# Patient Record
Sex: Male | Born: 1990 | Race: White | Hispanic: No | Marital: Single | Smoking: Former smoker
Health system: Southern US, Community
[De-identification: ages and names within clinical notes are randomized; demographics above are authoritative.]

## PROBLEM LIST (undated history)

## (undated) DIAGNOSIS — N289 Disorder of kidney and ureter, unspecified: Secondary | ICD-10-CM

## (undated) DIAGNOSIS — G43909 Migraine, unspecified, not intractable, without status migrainosus: Secondary | ICD-10-CM

## (undated) DIAGNOSIS — I10 Essential (primary) hypertension: Secondary | ICD-10-CM

---

## 2017-08-06 ENCOUNTER — Encounter: Payer: Self-pay | Admitting: Emergency Medicine

## 2017-08-06 ENCOUNTER — Emergency Department
Admission: EM | Admit: 2017-08-06 | Discharge: 2017-08-06 | Disposition: A | Discharge status: Home / Self Care | Payer: Commercial Managed Care - PPO | Attending: Emergency Medicine | Admitting: Emergency Medicine

## 2017-08-06 ENCOUNTER — Other Ambulatory Visit: Payer: Self-pay

## 2017-08-06 DIAGNOSIS — I10 Essential (primary) hypertension: Secondary | ICD-10-CM | POA: Diagnosis not present

## 2017-08-06 DIAGNOSIS — R51 Headache: Secondary | ICD-10-CM | POA: Diagnosis present

## 2017-08-06 DIAGNOSIS — G43809 Other migraine, not intractable, without status migrainosus: Secondary | ICD-10-CM

## 2017-08-06 HISTORY — DX: Migraine, unspecified, not intractable, without status migrainosus: G43.909

## 2017-08-06 HISTORY — DX: Essential (primary) hypertension: I10

## 2017-08-06 HISTORY — DX: Disorder of kidney and ureter, unspecified: N28.9

## 2017-08-06 MED ORDER — KETOROLAC TROMETHAMINE 30 MG/ML IJ SOLN
30.0000 mg | Freq: Once | INTRAMUSCULAR | Status: AC
  Administered 2017-08-06: 30 mg via INTRAVENOUS
  Filled 2017-08-06: qty 1

## 2017-08-06 MED ORDER — MAGNESIUM SULFATE IN D5W 1-5 GM/100ML-% IV SOLN
1.0000 g | Freq: Once | INTRAVENOUS | Status: AC
  Administered 2017-08-06: 1 g via INTRAVENOUS
  Filled 2017-08-06: qty 100

## 2017-08-06 MED ORDER — KETOROLAC TROMETHAMINE 10 MG PO TABS: 10.0000 mg | ORAL_TABLET | Freq: Three times a day (TID) | ORAL | 0 refills | Status: AC | PRN

## 2017-08-06 MED ORDER — PROCHLORPERAZINE EDISYLATE 5 MG/ML IJ SOLN
10.0000 mg | Freq: Once | INTRAMUSCULAR | Status: AC
  Administered 2017-08-06: 10 mg via INTRAVENOUS
  Filled 2017-08-06: qty 2

## 2017-08-06 MED ORDER — SODIUM CHLORIDE 0.9 % IV BOLUS (SEPSIS)
1000.0000 mL | Freq: Once | INTRAVENOUS | Status: AC
  Administered 2017-08-06: 1000 mL via INTRAVENOUS

## 2017-08-06 MED ORDER — VALSARTAN-HYDROCHLOROTHIAZIDE 320-25 MG PO TABS: 1.0000 | ORAL_TABLET | Freq: Every day | ORAL | 0 refills | Status: AC

## 2017-08-06 NOTE — Discharge Instructions (Signed)
Please seek medical attention for any high fevers, chest pain, shortness of breath, change in behavior, persistent vomiting, bloody stool or any other new or concerning symptoms.  

## 2017-08-06 NOTE — ED Notes (Signed)
MD at bedside to update pt

## 2017-08-06 NOTE — ED Notes (Signed)
Pt talking very quietly but answering all questions correctly. PT reports he has 2 medications for BP and has not been taking either one for a month. PT reports he could not afford the medication and now has insurance but no prescription for medication.

## 2017-08-06 NOTE — ED Provider Notes (Signed)
[facility] Emergency Department Provider Note   ____________________________________________   I have reviewed the triage vital signs and the nursing notes.   HISTORY  Chief Complaint Headache and Hypertension   History limited by: Not Limited   HPI Crissie SicklesLucas Basnett is a 26 y.o. male who presents to the emergency department today because of headache.   LOCATION:behind right eye DURATION:6 hours TIMING: started gradually and has worsened SEVERITY: severe CONTEXT: patient states he has a history of migraines. Today's headache feels like his normal migraine headache. He does not have any dedicated migraine medication but states he does have blood pressure medication - however he ran out roughly 1 month ago. MODIFYING FACTORS: lights make it worse ASSOCIATED SYMPTOMS: has had some nausea.  Per medical record review patient has a history of migraines, HTN.  Past Medical History:  Diagnosis Date  . Hypertension   . Migraines   . Renal disorder     There are no active problems to display for this patient.     Prior to Admission medications   Not on File    Allergies Patient has no known allergies.  No family history on file.  Social History Social History   Tobacco Use  . Smoking status: Never Smoker  . Smokeless tobacco: Never Used  Substance Use Topics  . Alcohol use: No    Frequency: Never  . Drug use: No    Review of Systems Constitutional: No fever/chills Eyes: Positive for photophobia.  ENT: No sore throat. Cardiovascular: Denies chest pain. Respiratory: Denies shortness of breath. Gastrointestinal: No abdominal pain.  No nausea, no vomiting.  No diarrhea.   Genitourinary: Negative for dysuria. Musculoskeletal: Negative for back pain. Skin: Negative for rash. Neurological: Positive for headache.  ____________________________________________   PHYSICAL EXAM:  VITAL SIGNS: ED Triage Vitals  Enc Vitals Group     BP  08/06/17 1646 (!) 173/121     Pulse Rate 08/06/17 1646 91     Resp 08/06/17 1646 16     Temp 08/06/17 1646 97.9 F (36.6 C)     Temp Source 08/06/17 1646 Oral     SpO2 08/06/17 1646 97 %     Weight 08/06/17 1646 246 lb (111.6 kg)     Height 08/06/17 1646 6' (1.829 m)     Head Circumference --      Peak Flow --      Pain Score 08/06/17 1645 8    Constitutional: Alert and oriented. Appears uncomfortable.  Eyes: Conjunctivae are normal.  ENT   Head: Normocephalic and atraumatic.   Nose: No congestion/rhinnorhea.   Mouth/Throat: Mucous membranes are moist.   Neck: No stridor. Hematological/Lymphatic/Immunilogical: No cervical lymphadenopathy. Cardiovascular: Normal rate, regular rhythm.  No murmurs, rubs, or gallops.  Respiratory: Normal respiratory effort without tachypnea nor retractions. Breath sounds are clear and equal bilaterally. No wheezes/rales/rhonchi. Gastrointestinal: Soft and non tender. No rebound. No guarding.  Genitourinary: Deferred Musculoskeletal: Normal range of motion in all extremities. No lower extremity edema. Neurologic:  Normal speech and language. No gross focal neurologic deficits are appreciated.  Skin:  Skin is warm, dry and intact. No rash noted. Psychiatric: Mood and affect are normal. Speech and behavior are normal. Patient exhibits appropriate insight and judgment.  ____________________________________________    LABS (pertinent positives/negatives)  None  ____________________________________________   EKG  None  ____________________________________________    RADIOLOGY  None   ____________________________________________   PROCEDURES  Procedures  ____________________________________________   INITIAL IMPRESSION / ASSESSMENT AND PLAN /  ED COURSE  Pertinent labs & imaging results that were available during my care of the patient were reviewed by me and considered in my medical decision making (see chart for  details).  Patient presented to the emergency department today because of concerns for severe migraine and high blood pressure.  Patient has history of migraines.  States this feels similar to these migraines in the past.  Did not think any emergent neuro imaging is required.  Patient did feel better after IV fluids and medication.  Will give patient prescription for home hypertensive medication.  Confirmed dose with patient.  Patient states he will try to follow up with a primary care doctor in roughly 1 week.  ____________________________________________   FINAL CLINICAL IMPRESSION(S) / ED DIAGNOSES  Final diagnoses:  Other migraine without status migrainosus, not intractable  Hypertension, unspecified type     Note: This dictation was prepared with Dragon dictation. Any transcriptional errors that result from this process are unintentional     Phineas SemenGoodman, Francisco Ostrovsky, MD 08/06/17 [phone]

## 2017-08-06 NOTE — ED Triage Notes (Signed)
Pt to ED via POV c/o migraine and high blood pressure. Pt states that he has hx/o HTN but he has not taken this blood pressure medication for "a while". Pt states that he has not had insurance to get his medication.   Pt states that today around 1130 his head started hurting, pt is having N/V, photosensitivity. Pt appears uncomfortable.

## 2018-10-17 ENCOUNTER — Other Ambulatory Visit: Payer: Self-pay

## 2018-10-17 ENCOUNTER — Emergency Department
Admission: EM | Admit: 2018-10-17 | Discharge: 2018-10-17 | Disposition: A | Discharge status: Home / Self Care | Payer: Commercial Managed Care - PPO | Attending: Emergency Medicine | Admitting: Emergency Medicine

## 2018-10-17 ENCOUNTER — Emergency Department: Payer: Commercial Managed Care - PPO

## 2018-10-17 DIAGNOSIS — R519 Headache, unspecified: Secondary | ICD-10-CM

## 2018-10-17 DIAGNOSIS — I10 Essential (primary) hypertension: Secondary | ICD-10-CM | POA: Insufficient documentation

## 2018-10-17 DIAGNOSIS — Z79899 Other long term (current) drug therapy: Secondary | ICD-10-CM | POA: Insufficient documentation

## 2018-10-17 DIAGNOSIS — R51 Headache: Secondary | ICD-10-CM | POA: Diagnosis not present

## 2018-10-17 DIAGNOSIS — Z87891 Personal history of nicotine dependence: Secondary | ICD-10-CM | POA: Diagnosis not present

## 2018-10-17 MED ORDER — PROCHLORPERAZINE EDISYLATE 10 MG/2ML IJ SOLN
10.0000 mg | Freq: Once | INTRAMUSCULAR | Status: AC
  Administered 2018-10-17: 10 mg via INTRAVENOUS
  Filled 2018-10-17: qty 2

## 2018-10-17 MED ORDER — KETOROLAC TROMETHAMINE 30 MG/ML IJ SOLN
15.0000 mg | Freq: Once | INTRAMUSCULAR | Status: AC
  Administered 2018-10-17: 15 mg via INTRAVENOUS
  Filled 2018-10-17: qty 1

## 2018-10-17 MED ORDER — SODIUM CHLORIDE 0.9 % IV BOLUS
1000.0000 mL | Freq: Once | INTRAVENOUS | Status: AC
  Administered 2018-10-17: 1000 mL via INTRAVENOUS

## 2018-10-17 MED ORDER — DIPHENHYDRAMINE HCL 50 MG/ML IJ SOLN
12.5000 mg | Freq: Once | INTRAMUSCULAR | Status: AC
  Administered 2018-10-17: 12.5 mg via INTRAVENOUS
  Filled 2018-10-17: qty 1

## 2018-10-17 NOTE — ED Triage Notes (Signed)
Pt states HA since 4pm yesterday. Hx migraines. A&O, ambulatory. Points to top of head. No distress noted. States movement makes it worse.

## 2018-10-17 NOTE — ED Provider Notes (Addendum)
[facility] Emergency Department Provider Note  ____________________________________________   I have reviewed the triage vital signs and the nursing notes. Where available I have reviewed prior notes and, if possible and indicated, outside hospital notes.    HISTORY  Chief Complaint Headache    HPI Clarence Hardy is a 28 y.o. male   Who presents today complaining of a headache.  Patient has a long history of headaches going back several years.  He states that he has headaches for a month and sometimes every few weeks he has never seen a neurologist he has been to the emergency room here in [place] for this several times.  He states he is never had any imaging.  He is having his usual headache.  No stiff neck no fever, no cough no diarrhea.  Nodule onset today, nothing atypical about this for him.  No focal neurologic findings.  In between headaches has been and continues to be at his baseline with no neurologic symptoms.  He has never had any significant head injury that he can think of.  He has no focal symptoms.  He says sometimes these are triggered by URIs, sometimes they are triggered by stress sometimes they just come on their own.    Past Medical History:  Diagnosis Date  . Hypertension   . Migraines   . Renal disorder     There are no active problems to display for this patient.   History reviewed. No pertinent surgical history.  Prior to Admission medications   Medication Sig Start Date End Date Taking? Authorizing Provider  ketorolac (TORADOL) 10 MG tablet Take 1 tablet (10 mg total) by mouth every 8 (eight) hours as needed for severe pain. 08/06/17   Phineas Semen, MD  valsartan-hydrochlorothiazide (DIOVAN-HCT) 320-25 MG tablet Take 1 tablet by mouth daily. 08/06/17   Phineas Semen, MD    Allergies Patient has no known allergies.  History reviewed. No pertinent family history.  Social History Social History   Tobacco Use  .  Smoking status: Former Games developer  . Smokeless tobacco: Never Used  Substance Use Topics  . Alcohol use: Yes    Frequency: Never  . Drug use: No    Review of Systems Constitutional: No fever/chills Eyes: No visual changes. ENT: No sore throat. No stiff neck no neck pain Cardiovascular: Denies chest pain. Respiratory: Denies shortness of breath. Gastrointestinal:   no vomiting.  No diarrhea.  No constipation. Genitourinary: Negative for dysuria. Musculoskeletal: Negative lower extremity swelling Skin: Negative for rash. Neurological: Negative for severe atypical headaches, focal weakness or numbness.   ____________________________________________   PHYSICAL EXAM:  VITAL SIGNS: ED Triage Vitals  Enc Vitals Group     BP 10/17/18 1714 121/82     Pulse Rate 10/17/18 1714 (!) 108     Resp 10/17/18 1714 16     Temp 10/17/18 1714 100.1 F (37.8 C)     Temp Source 10/17/18 1714 Oral     SpO2 10/17/18 1714 97 %     Weight 10/17/18 1715 250 lb (113.4 kg)     Height 10/17/18 1715 6' (1.829 m)     Head Circumference --      Peak Flow --      Pain Score 10/17/18 1718 4     Pain Loc --      Pain Edu? --      Excl. in GC? --     Constitutional: Alert and oriented. Well appearing and in no acute  distress.  Keeps his eyes closed but can open them with no difficulty he is awake and alert and looks quite well except for he appears to be mildly uncomfortable from a headache Eyes: Conjunctivae are normal Head: Atraumatic HEENT: No congestion/rhinnorhea. Mucous membranes are moist.  Oropharynx non-erythematous Neck:   Nontender with no meningismus, no masses, no stridor Cardiovascular: Normal rate, regular rhythm. Grossly normal heart sounds.  Good peripheral circulation. Respiratory: Normal respiratory effort.  No retractions. Lungs CTAB. Abdominal: Soft and nontender. No distention. No guarding no rebound Back:  There is no focal tenderness or step off.  there is no midline tenderness  there are no lesions noted. there is no CVA tenderness Musculoskeletal: No lower extremity tenderness, no upper extremity tenderness. No joint effusions, no DVT signs strong distal pulses no edema Neurologic: Cranial nerves II through XII are grossly intact 5 out of 5 strength bilateral upper and lower extremity. Finger to nose within normal limits heel to shin within normal limits, speech is normal with no word finding difficulty or dysarthria, reflexes symmetric, pupils are equally round and reactive to light, there is no pronator drift, sensation is normal, vision is intact to confrontation, gait is deferred, there is no nystagmus, normal neurologic exam Skin:  Skin is warm, dry and intact. No rash noted. Psychiatric: Mood and affect are normal. Speech and behavior are normal.  ____________________________________________   LABS (all labs ordered are listed, but only abnormal results are displayed)  Labs Reviewed - No data to display  Pertinent labs  results that were available during my care of the patient were reviewed by me and considered in my medical decision making (see chart for details). ____________________________________________  EKG  I personally interpreted any EKGs ordered by me or triage  ____________________________________________  RADIOLOGY  Pertinent labs & imaging results that were available during my care of the patient were reviewed by me and considered in my medical decision making (see chart for details). If possible, patient and/or family made aware of any abnormal findings.  No results found. ____________________________________________    PROCEDURES  Procedure(s) performed: None  Procedures  Critical Care performed: None  ____________________________________________   INITIAL IMPRESSION / ASSESSMENT AND PLAN / ED COURSE  Pertinent labs & imaging results that were available during my care of the patient were reviewed by me and considered in my  medical decision making (see chart for details).  Here with a headache which she describes as his usual migraine which is chronic and recurrent.  No evidence of meningitis nothing to suggest head bleed however patient has had migraines that are debilitating and very frequent for years and never had any imaging.  I will obtain a CT scan as a screening exam, he likely will also need an outpatient MRI, we will give him medication to see if we can get his headache better, he is nontoxic in appearance I have low suspicion for other acute intracranial pathology is my hope that the migraine cocktail and or other interventions will make him feel better and that the imaging will at least give [place] some idea as a get an basic screening test, to make sure that there is no other large abnormality noted which would I hope to get him time to see neurology for his chronic headaches.  ----------------------------------------- 8:19 PM on 10/17/2018 -----------------------------------------  Patient feeling somewhat better CT negative, neurologically intact signed out the end of my shift document    ____________________________________________   FINAL CLINICAL IMPRESSION(S) / ED DIAGNOSES  Final diagnoses:  None      This chart was dictated using voice recognition software.  Despite best efforts to proofread,  errors can occur which can change meaning.      Jeanmarie PlantMcShane,  A, MD 10/17/18 Wynetta [email or web address]    Jeanmarie PlantMcShane,  A, MD 10/17/18 2019

## 2018-10-17 NOTE — Discharge Instructions (Addendum)
Please seek medical attention for any high fevers, chest pain, shortness of breath, change in behavior, persistent vomiting, bloody stool or any other new or concerning symptoms.  

## 2018-10-17 NOTE — ED Notes (Signed)
Pt arrives with c/o migraine headache - he has hx of same - report nausea and photophobia - headache usually resolves with excedrin migraine but this time has continued

## 2018-10-18 DIAGNOSIS — R51 Headache: Secondary | ICD-10-CM | POA: Diagnosis not present

## 2018-10-25 DIAGNOSIS — G43019 Migraine without aura, intractable, without status migrainosus: Secondary | ICD-10-CM | POA: Diagnosis not present

## 2018-10-25 DIAGNOSIS — R0689 Other abnormalities of breathing: Secondary | ICD-10-CM | POA: Diagnosis not present

## 2018-10-25 DIAGNOSIS — E538 Deficiency of other specified B group vitamins: Secondary | ICD-10-CM | POA: Diagnosis not present

## 2018-10-25 DIAGNOSIS — E559 Vitamin D deficiency, unspecified: Secondary | ICD-10-CM | POA: Diagnosis not present

## 2018-10-25 DIAGNOSIS — R0683 Snoring: Secondary | ICD-10-CM | POA: Diagnosis not present

## 2018-10-31 ENCOUNTER — Other Ambulatory Visit: Payer: Self-pay | Admitting: Neurology

## 2018-10-31 DIAGNOSIS — G43019 Migraine without aura, intractable, without status migrainosus: Secondary | ICD-10-CM

## 2018-11-05 ENCOUNTER — Other Ambulatory Visit: Payer: Self-pay

## 2018-11-05 ENCOUNTER — Ambulatory Visit
Admission: RE | Admit: 2018-11-05 | Discharge: 2018-11-05 | Disposition: A | Discharge status: Home / Self Care | Payer: Commercial Managed Care - PPO | Source: Ambulatory Visit | Attending: Neurology | Admitting: Neurology

## 2018-11-05 ENCOUNTER — Ambulatory Visit: Payer: Commercial Managed Care - PPO

## 2018-11-05 DIAGNOSIS — G43019 Migraine without aura, intractable, without status migrainosus: Secondary | ICD-10-CM | POA: Diagnosis not present

## 2018-11-05 DIAGNOSIS — R42 Dizziness and giddiness: Secondary | ICD-10-CM | POA: Diagnosis not present

## 2018-11-05 MED ORDER — GADOBUTROL 1 MMOL/ML IV SOLN
10.0000 mL | Freq: Once | INTRAVENOUS | Status: AC | PRN
  Administered 2018-11-05: 10 mL via INTRAVENOUS

## 2018-11-26 DIAGNOSIS — G43019 Migraine without aura, intractable, without status migrainosus: Secondary | ICD-10-CM | POA: Diagnosis not present

## 2020-05-20 IMAGING — CT CT HEAD W/O CM
3 series · 15 of 47 positions shown, 18 images · non-contrast
Comparison: None.

CLINICAL DATA: Recurrent chronic headaches.

EXAM:
CT HEAD WITHOUT CONTRAST
TECHNIQUE: Contiguous axial images were obtained from the base of the skull
through the vertex without intravenous contrast.

[Series 2: head wo · axial · 0.47mm/px · z∈[-147,-12]mm · 9 of 33 slices shown, 12 images]
[im 3/33  brain]
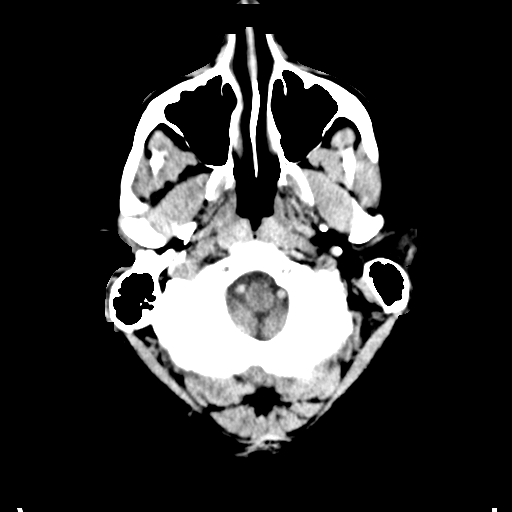
[im 3/33  bone]
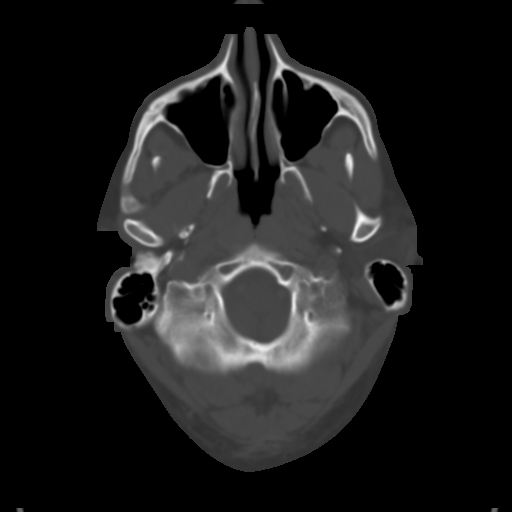
[im 6/33  brain]
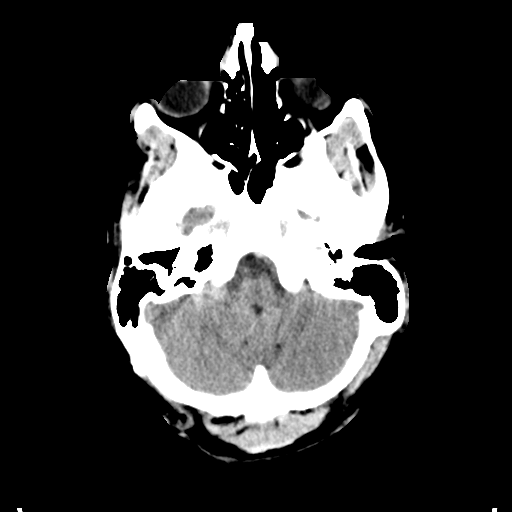
[im 9/33  brain]
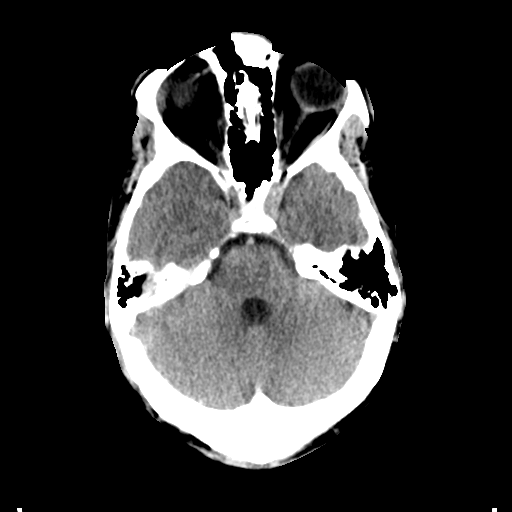
[im 13/33  brain]
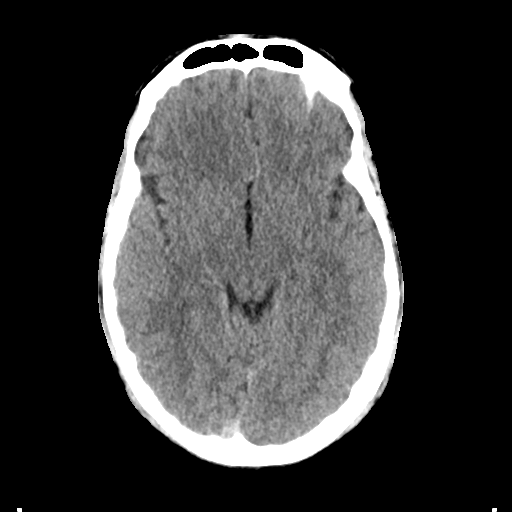
[im 17/33  brain]
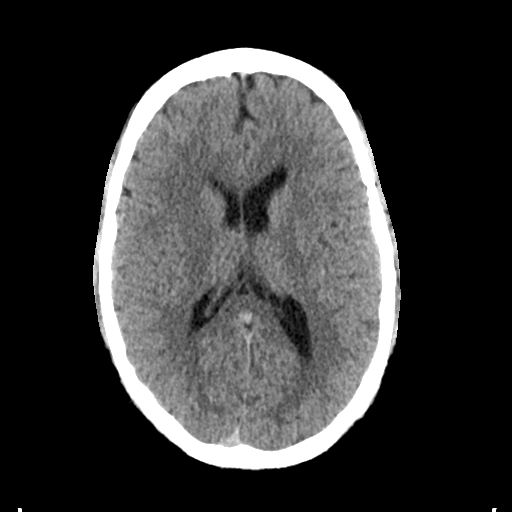
[im 17/33  bone]
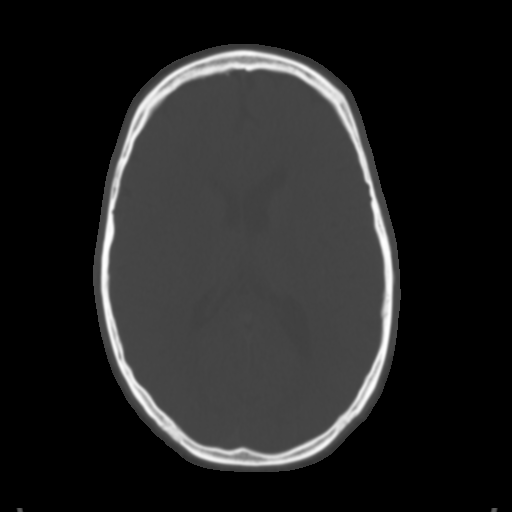
[im 20/33  brain]
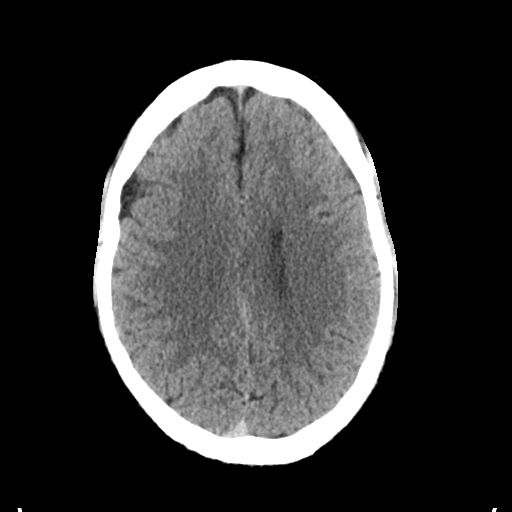
[im 24/33  brain]
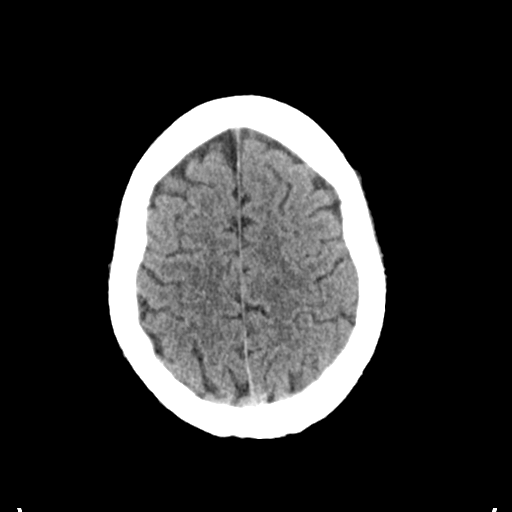
[im 27/33  brain]
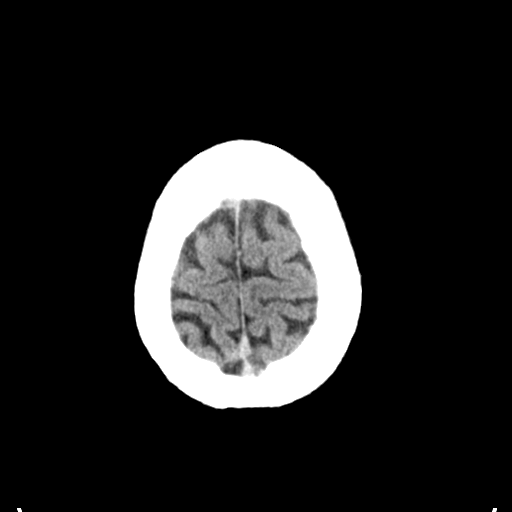
[im 30/33  brain]
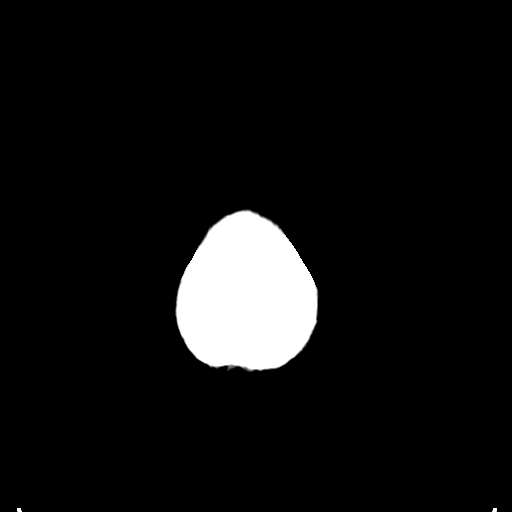
[im 30/33  bone]
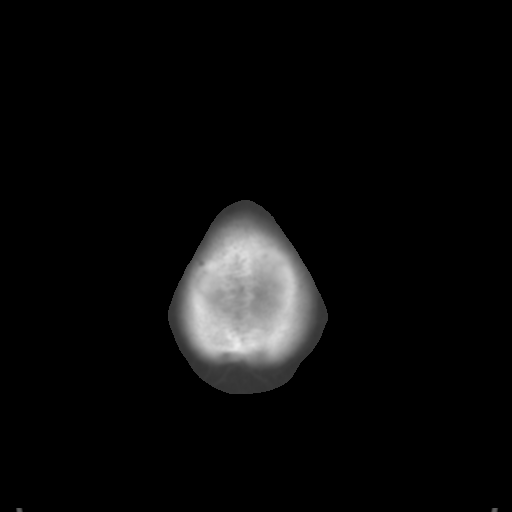

[Series 4: coronal soft tissue · coronal · 0.32mm/px · 3 of 69 slices shown]
[im 23/69  brain]
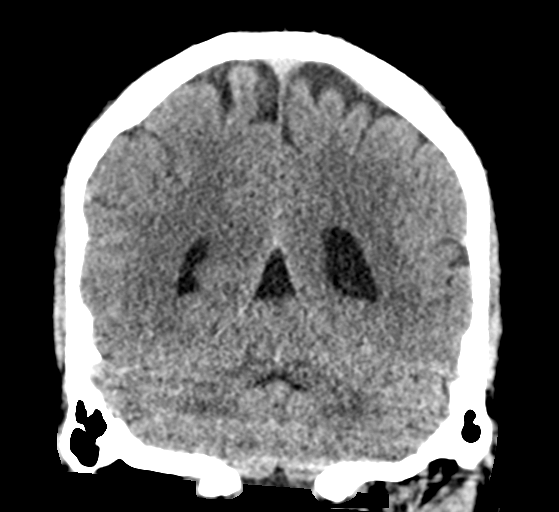
[im 31/69  brain]
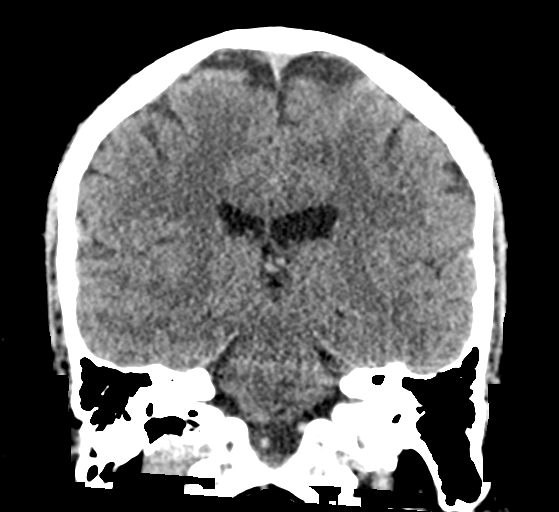
[im 38/69  brain]
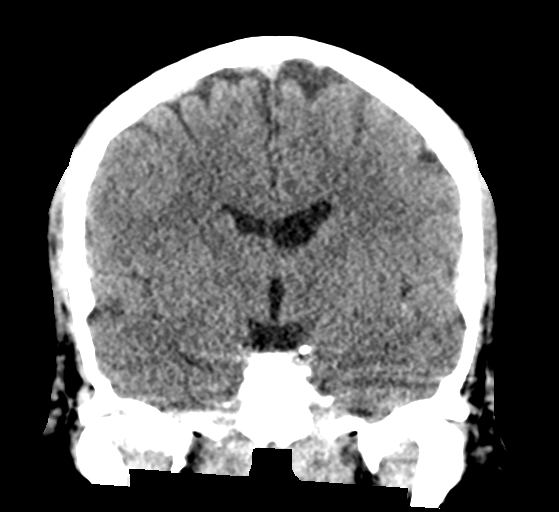

[Series 5: sagittal soft tissue · sagittal · 0.32mm/px · 3 of 54 slices shown]
[im 18/54  brain]
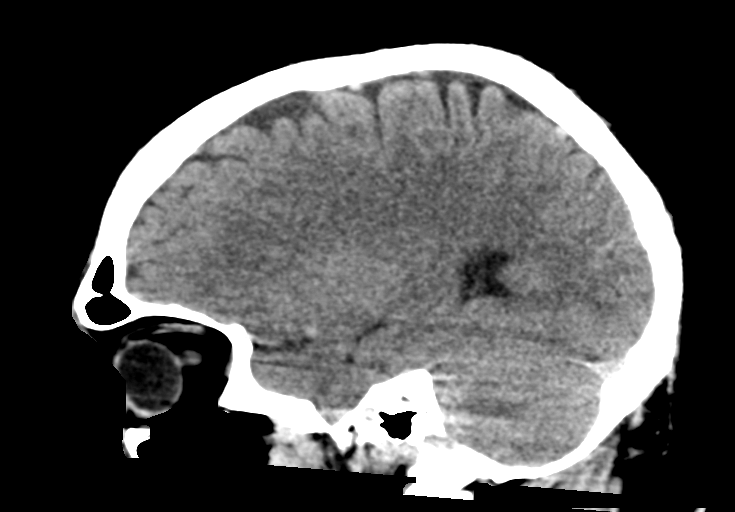
[im 27/54  brain]
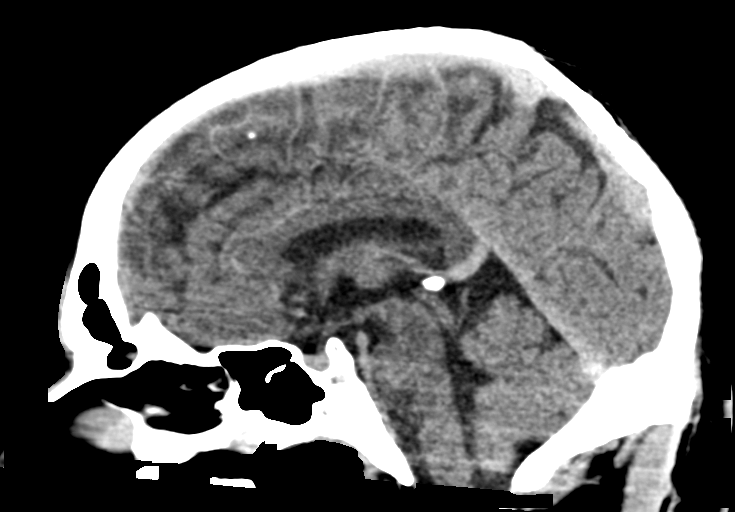
[im 36/54  brain]
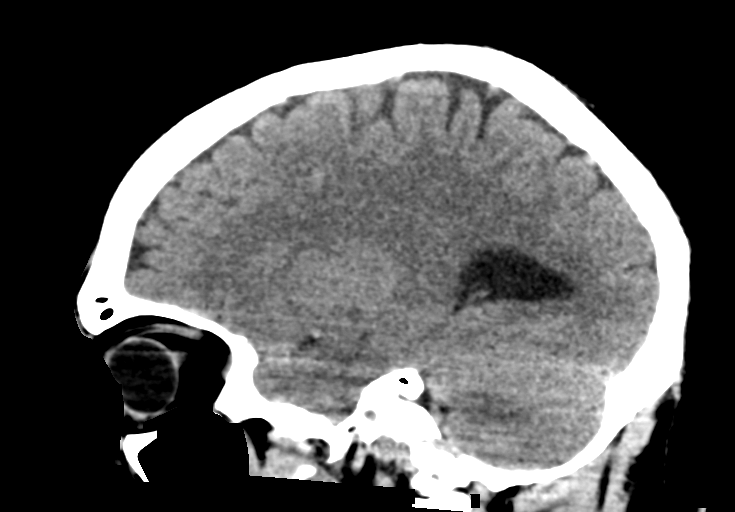

[15 of 47 positions shown; findings below may reference images not displayed]

FINDINGS: BRAIN: No intraparenchymal hemorrhage, mass effect nor midline
shift. The ventricles and sulci are normal. No acute large vascular
territory infarcts. No abnormal extra-axial fluid collections. Basal
cisterns are patent.

VASCULAR: Unremarkable.

SKULL/SOFT TISSUES: No skull fracture. No significant soft tissue
swelling.

ORBITS/SINUSES: The included ocular globes and orbital contents are
normal.Small LEFT maxillary mucosal retention cyst. Mastoid air
cells are well aerated.

OTHER: None.
IMPRESSION: Normal CT HEAD without contrast.

## 2022-01-13 DEATH — deceased
